# Patient Record
Sex: Female | Born: 1945 | Race: Black or African American | Hispanic: No | Marital: Married | Smoking: Never smoker
Health system: Southern US, Community
[De-identification: ages and names within clinical notes are randomized; demographics above are authoritative.]

## PROBLEM LIST (undated history)

## (undated) DIAGNOSIS — C801 Malignant (primary) neoplasm, unspecified: Secondary | ICD-10-CM

## (undated) DIAGNOSIS — I10 Essential (primary) hypertension: Secondary | ICD-10-CM

---

## 2013-08-30 ENCOUNTER — Encounter (HOSPITAL_COMMUNITY): Payer: Self-pay | Admitting: Emergency Medicine

## 2013-08-30 ENCOUNTER — Emergency Department (HOSPITAL_COMMUNITY)
Admission: EM | Admit: 2013-08-30 | Discharge: 2013-08-30 | Disposition: A | Discharge status: Home / Self Care | Payer: Medicare (Managed Care) | Attending: Emergency Medicine | Admitting: Emergency Medicine

## 2013-08-30 DIAGNOSIS — Z79899 Other long term (current) drug therapy: Secondary | ICD-10-CM | POA: Insufficient documentation

## 2013-08-30 DIAGNOSIS — H9209 Otalgia, unspecified ear: Secondary | ICD-10-CM | POA: Insufficient documentation

## 2013-08-30 DIAGNOSIS — K029 Dental caries, unspecified: Secondary | ICD-10-CM

## 2013-08-30 DIAGNOSIS — Z792 Long term (current) use of antibiotics: Secondary | ICD-10-CM | POA: Insufficient documentation

## 2013-08-30 DIAGNOSIS — Z859 Personal history of malignant neoplasm, unspecified: Secondary | ICD-10-CM | POA: Insufficient documentation

## 2013-08-30 DIAGNOSIS — I10 Essential (primary) hypertension: Secondary | ICD-10-CM | POA: Insufficient documentation

## 2013-08-30 HISTORY — DX: Essential (primary) hypertension: I10

## 2013-08-30 HISTORY — DX: Malignant (primary) neoplasm, unspecified: C80.1

## 2013-08-30 MED ORDER — HYDROCODONE-ACETAMINOPHEN 5-325 MG PO TABS
2.0000 | ORAL_TABLET | Freq: Once | ORAL | Status: AC
  Administered 2013-08-30: 2 via ORAL
  Filled 2013-08-30: qty 2

## 2013-08-30 MED ORDER — PENICILLIN V POTASSIUM 500 MG PO TABS: 500.0000 mg | ORAL_TABLET | Freq: Four times a day (QID) | ORAL | Status: DC

## 2013-08-30 MED ORDER — HYDROCODONE-ACETAMINOPHEN 5-325 MG PO TABS: 1.0000 | ORAL_TABLET | Freq: Four times a day (QID) | ORAL | Status: DC | PRN

## 2013-08-30 NOTE — ED Notes (Signed)
Pt c/o left sided dental pain x 3 days since filling came out; pt sts pain up into left ear but sts needs antibiotics prior to dental procedures

## 2013-08-30 NOTE — ED Provider Notes (Signed)
Medical screening examination/treatment/procedure(s) were performed by non-physician practitioner and as supervising physician I was immediately available for consultation/collaboration.  EKG Interpretation   None        Alyanna Stoermer, MD 08/30/13 1617 

## 2013-08-30 NOTE — ED Provider Notes (Signed)
CSN: [id number]     Arrival date & time 08/30/13  1142 History  This chart was scribed for non-physician practitioner, Junius Finner, PA-C working with Doug Sou, MD by Greggory Stallion, ED scribe. This patient was seen in room TR06C/TR06C and the patient's care was started at 12:32 PM.   Chief Complaint  Patient presents with  . Dental Pain   The history is provided by the patient. No language interpreter was used.   HPI Comments: Jennifer Proctor is a 67 y.o. female who presents to the Emergency Department complaining of gradual onset, constant throbbing left lower dental pain that radiates into her left ear that started 3 days ago. Pt rates the pain 10/10. She states a filling came out 4-5 days ago and her tooth didn't start hurting until a few days after.  Does not recall eating anything hard or unusual to cause filling to come out.  Pt has taken ibuprofen, Orajel and oral cement with no relief. She is not from around here so she has not seen a dentist yet but reports she will be in the area for several weeks. Pt denies fever.   Past Medical History  Diagnosis Date  . Cancer   . Hypertension    History reviewed. No pertinent past surgical history. History reviewed. No pertinent family history. History  Substance Use Topics  . Smoking status: Never Smoker   . Smokeless tobacco: Not on file  . Alcohol Use: No   OB History   Grav Para Term Preterm Abortions TAB SAB Ect Mult Living                 Review of Systems  Constitutional: Negative for fever.  HENT: Positive for dental problem and ear pain.   All other systems reviewed and are negative.   Allergies  Review of patient's allergies indicates no known allergies.  Home Medications   Current Outpatient Rx  Name  Route  Sig  Dispense  Refill  . Cholecalciferol (VITAMIN D) 2000 UNITS CAPS   Oral   Take 1 capsule by mouth daily.         Marland Kitchen lisinopril-hydrochlorothiazide (PRINZIDE,ZESTORETIC) 20-25 MG per tablet   Oral   Take 1 tablet by mouth daily.         Marland Kitchen VITAMIN E PO   Oral   Take 1 capsule by mouth daily.         Marland Kitchen HYDROcodone-acetaminophen (NORCO/VICODIN) 5-325 MG per tablet   Oral   Take 1-2 tablets by mouth every 6 (six) hours as needed for moderate pain.   10 tablet   0   . penicillin v potassium (VEETID) 500 MG tablet   Oral   Take 1 tablet (500 mg total) by mouth 4 (four) times daily.   40 tablet   0     BP 179/70  Pulse 90  Temp(Src) 97.9 F (36.6 C) (Oral)  Resp 18  SpO2 100%  Physical Exam  Nursing note and vitals reviewed. Constitutional: She appears well-developed and well-nourished. No distress.  HENT:  Head: Normocephalic and atraumatic.  Right Ear: Hearing, tympanic membrane, external ear and ear canal normal.  Left Ear: Hearing, tympanic membrane, external ear and ear canal normal.  Nose: Nose normal.  Mouth/Throat: Uvula is midline, oropharynx is clear and moist and mucous membranes are normal. She has dentures. No oral lesions. No trismus in the jaw. Abnormal dentition. Dental caries present. No dental abscesses, uvula swelling or lacerations. No oropharyngeal exudate, posterior oropharyngeal edema, posterior  oropharyngeal erythema or tonsillar abscesses.    Multiple fillings in place in left lower molars and premolars.  Visible tooth "cement" in place between 2nd to last and last molar.  TTP. No active drainage or surrounding induration or edema.  Eyes: Conjunctivae are normal. No scleral icterus.  Neck: Normal range of motion. Neck supple.  Cardiovascular: Normal rate, regular rhythm and normal heart sounds.   Pulmonary/Chest: Effort normal and breath sounds normal. No respiratory distress. She has no wheezes. She has no rales. She exhibits no tenderness.  Musculoskeletal: Normal range of motion.  Lymphadenopathy:    She has no cervical adenopathy.  Neurological: She is alert.  Skin: Skin is warm and dry. She is not diaphoretic.  Psychiatric: She has a  normal mood and affect. Her behavior is normal.    ED Course  Procedures (including critical care time)  DIAGNOSTIC STUDIES: Oxygen Saturation is 100% on RA, normal by my interpretation.    COORDINATION OF CARE: 12:36 PM-Discussed treatment plan which includes an antibiotic and pain medication with pt at bedside and pt agreed to plan. Advised pt to follow up with a dentist this week.  Labs Review Labs Reviewed - No data to display Imaging Review No results found.  EKG Interpretation   None       MDM   1. Pain due to dental caries    Pt with dental pain due to filling coming out. No drainable dental abscess on exam. Rx: norco and PCN.  Provided dental resource guide as well as flyer for [facility] in [place] 11/13-11/15.  Return precautions provided. Pt verbalized understanding and agreement with tx plan.   I personally performed the services described in this documentation, which was scribed in my presence. The recorded information has been reviewed and is accurate.   Junius Finner, PA-C 08/30/13 1610

## 2017-12-29 ENCOUNTER — Encounter (HOSPITAL_COMMUNITY): Payer: Self-pay | Admitting: *Deleted

## 2017-12-29 ENCOUNTER — Observation Stay (HOSPITAL_COMMUNITY)
Admission: AD | Admit: 2017-12-29 | Discharge: 2017-12-30 | Disposition: A | Discharge status: Home / Self Care | Payer: Medicare (Managed Care) | Source: Ambulatory Visit | Attending: Cardiovascular Disease | Admitting: Cardiovascular Disease

## 2017-12-29 ENCOUNTER — Other Ambulatory Visit: Payer: Self-pay

## 2017-12-29 ENCOUNTER — Observation Stay (HOSPITAL_COMMUNITY): Payer: Medicare (Managed Care)

## 2017-12-29 DIAGNOSIS — Z8739 Personal history of other diseases of the musculoskeletal system and connective tissue: Secondary | ICD-10-CM | POA: Insufficient documentation

## 2017-12-29 DIAGNOSIS — N182 Chronic kidney disease, stage 2 (mild): Secondary | ICD-10-CM | POA: Diagnosis not present

## 2017-12-29 DIAGNOSIS — I249 Acute ischemic heart disease, unspecified: Secondary | ICD-10-CM | POA: Diagnosis present

## 2017-12-29 DIAGNOSIS — I129 Hypertensive chronic kidney disease with stage 1 through stage 4 chronic kidney disease, or unspecified chronic kidney disease: Secondary | ICD-10-CM | POA: Diagnosis not present

## 2017-12-29 DIAGNOSIS — R072 Precordial pain: Principal | ICD-10-CM | POA: Diagnosis present

## 2017-12-29 DIAGNOSIS — I4891 Unspecified atrial fibrillation: Secondary | ICD-10-CM | POA: Insufficient documentation

## 2017-12-29 DIAGNOSIS — E669 Obesity, unspecified: Secondary | ICD-10-CM | POA: Diagnosis not present

## 2017-12-29 LAB — CBC WITH DIFFERENTIAL/PLATELET
BASOS ABS: 0 10*3/uL (ref 0.0–0.1)
BASOS PCT: 0 %
EOS ABS: 0.1 10*3/uL (ref 0.0–0.7)
Eosinophils Relative: 2 %
HCT: 41.9 % (ref 36.0–46.0)
Hemoglobin: 13.9 g/dL (ref 12.0–15.0)
Lymphocytes Relative: 38 %
Lymphs Abs: 2.3 10*3/uL (ref 0.7–4.0)
MCH: 29 pg (ref 26.0–34.0)
MCHC: 33.2 g/dL (ref 30.0–36.0)
MCV: 87.5 fL (ref 78.0–100.0)
MONO ABS: 0.5 10*3/uL (ref 0.1–1.0)
Monocytes Relative: 7 %
Neutro Abs: 3.2 10*3/uL (ref 1.7–7.7)
Neutrophils Relative %: 53 %
PLATELETS: 343 10*3/uL (ref 150–400)
RBC: 4.79 MIL/uL (ref 3.87–5.11)
RDW: 13.5 % (ref 11.5–15.5)
WBC: 6.1 10*3/uL (ref 4.0–10.5)

## 2017-12-29 LAB — COMPREHENSIVE METABOLIC PANEL
ALT: 16 U/L (ref 14–54)
AST: 20 U/L (ref 15–41)
Albumin: 3.6 g/dL (ref 3.5–5.0)
Alkaline Phosphatase: 68 U/L (ref 38–126)
Anion gap: 13 (ref 5–15)
BILIRUBIN TOTAL: 0.8 mg/dL (ref 0.3–1.2)
BUN: 19 mg/dL (ref 6–20)
CHLORIDE: 99 mmol/L — AB (ref 101–111)
CO2: 23 mmol/L (ref 22–32)
Calcium: 9 mg/dL (ref 8.9–10.3)
Creatinine, Ser: 1.54 mg/dL — ABNORMAL HIGH (ref 0.44–1.00)
GFR, EST AFRICAN AMERICAN: 38 mL/min — AB (ref >60)
GFR, EST NON AFRICAN AMERICAN: 33 mL/min — AB (ref >60)
Glucose, Bld: 96 mg/dL (ref 65–99)
Potassium: 3.9 mmol/L (ref 3.5–5.1)
Sodium: 135 mmol/L (ref 135–145)
TOTAL PROTEIN: 8.4 g/dL — AB (ref 6.5–8.1)

## 2017-12-29 LAB — TROPONIN I

## 2017-12-29 MED ORDER — ONDANSETRON HCL 4 MG/2ML IJ SOLN: 4.0000 mg | Freq: Four times a day (QID) | INTRAMUSCULAR | Status: DC | PRN

## 2017-12-29 MED ORDER — ACETAMINOPHEN 325 MG PO TABS: 650.0000 mg | ORAL_TABLET | ORAL | Status: DC | PRN

## 2017-12-29 MED ORDER — METOPROLOL TARTRATE 12.5 MG HALF TABLET
12.5000 mg | ORAL_TABLET | Freq: Two times a day (BID) | ORAL | Status: DC
  Administered 2017-12-30: 12.5 mg via ORAL
  Filled 2017-12-29 (×2): qty 1

## 2017-12-29 MED ORDER — OXYCODONE HCL 5 MG PO TABS: 5.0000 mg | ORAL_TABLET | Freq: Four times a day (QID) | ORAL | Status: DC | PRN

## 2017-12-29 MED ORDER — LISINOPRIL-HYDROCHLOROTHIAZIDE 20-25 MG PO TABS: 1.0000 | ORAL_TABLET | Freq: Every day | ORAL | Status: DC

## 2017-12-29 MED ORDER — ASPIRIN EC 81 MG PO TBEC
81.0000 mg | DELAYED_RELEASE_TABLET | Freq: Every day | ORAL | Status: DC
  Administered 2017-12-30: 81 mg via ORAL
  Filled 2017-12-29: qty 1

## 2017-12-29 MED ORDER — LISINOPRIL 20 MG PO TABS
20.0000 mg | ORAL_TABLET | Freq: Every day | ORAL | Status: DC
  Administered 2017-12-30: 20 mg via ORAL
  Filled 2017-12-29: qty 1

## 2017-12-29 MED ORDER — ASPIRIN 300 MG RE SUPP: 300.0000 mg | RECTAL | Status: AC

## 2017-12-29 MED ORDER — ASPIRIN 81 MG PO CHEW
324.0000 mg | CHEWABLE_TABLET | ORAL | Status: AC
  Administered 2017-12-29: 324 mg via ORAL
  Filled 2017-12-29: qty 4

## 2017-12-29 MED ORDER — HYDROCHLOROTHIAZIDE 25 MG PO TABS
25.0000 mg | ORAL_TABLET | Freq: Every day | ORAL | Status: DC
  Administered 2017-12-30: 25 mg via ORAL
  Filled 2017-12-29: qty 1

## 2017-12-29 MED ORDER — ATORVASTATIN CALCIUM 40 MG PO TABS
40.0000 mg | ORAL_TABLET | Freq: Every day | ORAL | Status: DC
  Administered 2017-12-29: 40 mg via ORAL
  Filled 2017-12-29: qty 1

## 2017-12-29 MED ORDER — NITROGLYCERIN 0.4 MG SL SUBL: 0.4000 mg | SUBLINGUAL_TABLET | SUBLINGUAL | Status: DC | PRN

## 2017-12-29 NOTE — H&P (Signed)
Referring Physician: Dr. Lilli Few  Jennifer Proctor is an 72 y.o. female.                       Chief Complaint: Chest pain  HPI: 72 year old female has recurrent left precodial chest pain with numbness in hands and neck heaviness. She had nose bleed 4 days back and had headache 3 days back. Chest pain is usually exertional and relieved by rest. She has h/o of atrial fibrillation and is using Eliquis as anticoagulant. Her PMH also include hypertension, gout, renal insufficiency, obesity and osteoarthritis.  Past Medical History:  Diagnosis Date  . Cancer   . 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 .   No family history on file. Social History:  She quit smoking 35 years ago after smoking for 1 to 1.5 pack of cigarettes for 15 years. She does not have any smokeless tobacco history on file. She drinks socially after quitting 20 years ago. No street drug use.  Allergies: No Known Allergies  Medications Prior to Admission  Medication Sig Dispense Refill  .      .      . lisinopril-hydrochlorothiazide (PRINZIDE,ZESTORETIC) 20-25 MG per tablet Take 1 tablet by mouth daily.    .      . VITAMIN E PO Take 1 capsule by mouth daily.        Eliquis 5 mg. One twice daily.     Atorvastatin 40 mg. One q PM.     Allopurinol 300 mg. One daily.  Results for orders placed or performed during the hospital encounter of 12/29/17 (from the past 48 hour(s))  CBC WITH DIFFERENTIAL     Status: None   Collection Time: 12/29/17  7:55 PM  Result Value Ref Range   WBC 6.1 4.0 - 10.5 K/uL   RBC 4.79 3.87 - 5.11 MIL/uL   Hemoglobin 13.9 12.0 - 15.0 g/dL   HCT 41.9 36.0 - 46.0 %   MCV 87.5 78.0 - 100.0 fL   MCH 29.0 26.0 - 34.0 pg   MCHC 33.2 30.0 - 36.0 g/dL   RDW 13.5 11.5 - 15.5 %   Platelets 343 150 - 400 K/uL   Neutrophils Relative % 53 %   Neutro Abs 3.2 1.7 - 7.7 K/uL   Lymphocytes Relative 38 %   Lymphs Abs 2.3 0.7 -  4.0 K/uL   Monocytes Relative 7 %   Monocytes Absolute 0.5 0.1 - 1.0 K/uL   Eosinophils Relative 2 %   Eosinophils Absolute 0.1 0.0 - 0.7 K/uL   Basophils Relative 0 %   Basophils Absolute 0.0 0.0 - 0.1 K/uL    Comment: Performed at [facility], [address], [place], [place] [address]   No results found.  Review Of Systems Constitutional: No fever, chills, weight loss or gain. Eyes: No vision change, wears glasses. No discharge or pain. Ears: No hearing loss, No tinnitus. Respiratory: No asthma, COPD, pneumonias. No shortness of breath. No hemoptysis. Cardiovascular: Positive chest pain, palpitation, leg edema. Gastrointestinal: No nausea, vomiting, diarrhea, constipation. No GI bleed. No hepatitis. Genitourinary: No dysuria, hematuria, kidney stone. No incontinance. Neurological: No headache, stroke, seizures.  Psychiatry: Positive psych facility admission for anxiety, depression, suicide. No detox. Skin: No rash. Musculoskeletal: Positive joint pain, fibromyalgia. No neck pain, back pain. Lymphadenopathy: No lymphadenopathy. Hematology: No anemia or easy bruising.   Blood pressure 101/79,  pulse 93, temperature (!) 97.4 F (36.3 C), temperature source Oral, height 5\' 5"  (1.651 m), SpO2 96 %. There is no height or weight on file to calculate BMI. General appearance: alert, cooperative, appears stated age and no distress Head: Normocephalic, atraumatic. Eyes: Brown eyes, pink conjunctiva, corneas clear. PERRL, EOM's intact. Neck: No adenopathy, no carotid bruit, no JVD, supple, symmetrical, trachea midline and thyroid not enlarged. Resp: Clear to auscultation bilaterally. Cardio: Irregular rate and rhythm, S1, S2 normal, II/VI systolic murmur, no click, rub or gallop GI: Soft, non-tender; bowel sounds normal; no organomegaly. Extremities: No edema, cyanosis or clubbing. Skin: Warm and dry.  Neurologic: Alert and oriented X 3, normal strength. Normal coordination and  gait.  Assessment/Plan Acute coronary syndrome Hypertension Atrial fibrillation, chronic, CHA2DS2VASc score of 3 Obesity H/O gout H/O renal insufficiency  Place in observation Cycle troponin. Nuclear stress test in AM.  Birdie Riddle, MD  12/29/2017, 9:31 PM

## 2017-12-30 ENCOUNTER — Observation Stay (HOSPITAL_COMMUNITY): Payer: Medicare (Managed Care)

## 2017-12-30 DIAGNOSIS — I4891 Unspecified atrial fibrillation: Secondary | ICD-10-CM | POA: Diagnosis not present

## 2017-12-30 DIAGNOSIS — I129 Hypertensive chronic kidney disease with stage 1 through stage 4 chronic kidney disease, or unspecified chronic kidney disease: Secondary | ICD-10-CM | POA: Diagnosis not present

## 2017-12-30 DIAGNOSIS — N182 Chronic kidney disease, stage 2 (mild): Secondary | ICD-10-CM | POA: Diagnosis not present

## 2017-12-30 DIAGNOSIS — R072 Precordial pain: Secondary | ICD-10-CM | POA: Diagnosis not present

## 2017-12-30 LAB — BASIC METABOLIC PANEL
ANION GAP: 11 (ref 5–15)
BUN: 16 mg/dL (ref 6–20)
CHLORIDE: 101 mmol/L (ref 101–111)
CO2: 22 mmol/L (ref 22–32)
Calcium: 8.9 mg/dL (ref 8.9–10.3)
Creatinine, Ser: 1.31 mg/dL — ABNORMAL HIGH (ref 0.44–1.00)
GFR calc Af Amer: 46 mL/min — ABNORMAL LOW (ref >60)
GFR calc non Af Amer: 40 mL/min — ABNORMAL LOW (ref >60)
Glucose, Bld: 101 mg/dL — ABNORMAL HIGH (ref 65–99)
POTASSIUM: 3.9 mmol/L (ref 3.5–5.1)
SODIUM: 134 mmol/L — AB (ref 135–145)

## 2017-12-30 LAB — LIPID PANEL
Cholesterol: 155 mg/dL (ref 0–200)
HDL: 45 mg/dL (ref >40)
LDL Cholesterol: 98 mg/dL (ref 0–99)
TRIGLYCERIDES: 59 mg/dL (ref <150)
Total CHOL/HDL Ratio: 3.4 RATIO
VLDL: 12 mg/dL (ref 0–40)

## 2017-12-30 LAB — CBC
HEMATOCRIT: 44.4 % (ref 36.0–46.0)
HEMOGLOBIN: 14.6 g/dL (ref 12.0–15.0)
MCH: 29 pg (ref 26.0–34.0)
MCHC: 32.9 g/dL (ref 30.0–36.0)
MCV: 88.3 fL (ref 78.0–100.0)
Platelets: 301 10*3/uL (ref 150–400)
RBC: 5.03 MIL/uL (ref 3.87–5.11)
RDW: 13.9 % (ref 11.5–15.5)
WBC: 4.5 10*3/uL (ref 4.0–10.5)

## 2017-12-30 LAB — TROPONIN I

## 2017-12-30 LAB — PROTIME-INR
INR: 1.15
Prothrombin Time: 14.6 seconds (ref 11.4–15.2)

## 2017-12-30 MED ORDER — REGADENOSON 0.4 MG/5ML IV SOLN
INTRAVENOUS | Status: AC
  Filled 2017-12-30: qty 5

## 2017-12-30 MED ORDER — TECHNETIUM TC 99M TETROFOSMIN IV KIT
10.0000 | PACK | Freq: Once | INTRAVENOUS | Status: AC | PRN
  Administered 2017-12-30: 10 via INTRAVENOUS

## 2017-12-30 MED ORDER — METOPROLOL TARTRATE 25 MG PO TABS: 12.5000 mg | ORAL_TABLET | Freq: Two times a day (BID) | ORAL | 3 refills | Status: AC

## 2017-12-30 MED ORDER — NITROGLYCERIN 0.4 MG SL SUBL: 0.4000 mg | SUBLINGUAL_TABLET | SUBLINGUAL | 1 refills | Status: AC | PRN

## 2017-12-30 MED ORDER — REGADENOSON 0.4 MG/5ML IV SOLN
0.4000 mg | Freq: Once | INTRAVENOUS | Status: AC
  Administered 2017-12-30: 0.4 mg via INTRAVENOUS

## 2017-12-30 MED ORDER — TECHNETIUM TC 99M TETROFOSMIN IV KIT
30.0000 | PACK | Freq: Once | INTRAVENOUS | Status: AC | PRN
  Administered 2017-12-30: 30 via INTRAVENOUS

## 2017-12-30 NOTE — Care Management Obs Status (Signed)
[facility] NOTIFICATION   Patient Details  Name: Jennifer Proctor MRN: [id number] Date of Birth: [birth year]   Medicare Observation Status Notification Given:  Yes    Erenest Rasher, RN 12/30/2017, 5:11 PM

## 2017-12-30 NOTE — Discharge Summary (Signed)
Physician Discharge Summary  Patient ID: Casidee Jann MRN: [id number] DOB/AGE: [birth year] 72 y.o.  Admit date: 12/29/2017 Discharge date: 12/30/2017  Admission Diagnoses: Acute coronary syndrome Hypertension Atrial fibrillation, CHA2DS2VASc score of 3 Obesity H/O gout H/O renal insufficiency  Discharge Diagnoses:  Principal Problem:   Chest pain, precordial Active Problems:   Hypertension   Atrial fibrillation   Obesity   H/O gout   CKD, II  Discharged Condition: fair  Hospital Course: 72 year old female with recurrent chest pain had numbness in hands and neck heaviness. Her Troponins were negative. She underwent nuclear stress test without reversible ischemia and EF of 59 %. A small dose B-blocker was added. She will see primary doctor in 1 week and see me in 2 months when she returns to [place].  Consults: cardiology  Significant Diagnostic Studies: labs: Normal CBC, Troponin, lipid panel and CMET except Creatinine of 1.54.  Treatments: cardiac meds: Lisinopril-HCTZ, Eliquis, metoprolol, prn SL NTG tab. and atorvastatin.  Discharge Exam: Blood pressure 105/76, pulse 71, temperature 98 F (36.7 C), temperature source Oral, resp. rate 18, height 5\' 5"  (1.651 m), weight 121.3 kg (267 lb 8 oz), SpO2 98 %. General appearance: alert, cooperative and appears stated age. Head: Normocephalic, atraumatic. Eyes: Brown eyes, pink conjunctiva, corneas clear. PERRL, EOM's intact.  Neck: No adenopathy, no carotid bruit, no JVD, supple, symmetrical, trachea midline and thyroid not enlarged. Resp: Clear to auscultation bilaterally. Cardio: Regular rate and rhythm, S1, S2 normal, II/VI systolic murmur, no click, rub or gallop. GI: Soft, non-tender; bowel sounds normal; no organomegaly. Extremities: No edema, cyanosis or clubbing. Skin: Warm and dry.  Neurologic: Alert and oriented X 3, normal strength and tone. Normal coordination and slow gait.  Disposition: 01-Home or Self  Care   Allergies as of 12/30/2017   No Known Allergies     Medication List    TAKE these medications   atorvastatin 40 MG tablet Commonly known as:  LIPITOR Take 20 mg by mouth daily at 6 PM.   ELIQUIS 5 MG Tabs tablet Generic drug:  apixaban Take 5 mg by mouth 2 (two) times daily.   lisinopril-hydrochlorothiazide 20-25 MG tablet Commonly known as:  PRINZIDE,ZESTORETIC Take 1 tablet by mouth daily.   metoprolol tartrate 25 MG tablet Commonly known as:  LOPRESSOR Take 0.5 tablets (12.5 mg total) by mouth 2 (two) times daily.   nitroGLYCERIN 0.4 MG SL tablet Commonly known as:  NITROSTAT Place 1 tablet (0.4 mg total) under the tongue every 5 (five) minutes x 3 doses as needed for chest pain.   Vitamin D 2000 units Caps Take 1 capsule by mouth daily.      Follow-up Information    Dixie Dials, MD. Schedule an appointment as soon as possible for a visit in 2 month(s).   Specialty:  Cardiology Contact information: [place] [address] [phone]           Signed: Birdie Riddle 12/30/2017, 5:40 PM

## 2019-12-19 DEATH — deceased
